# Patient Record
Sex: Male | Born: 1990 | Race: White | Hispanic: No | Marital: Single | Smoking: Never smoker
Health system: Southern US, Community
[De-identification: ages and names within clinical notes are randomized; demographics above are authoritative.]

## PROBLEM LIST (undated history)

## (undated) DIAGNOSIS — F909 Attention-deficit hyperactivity disorder, unspecified type: Secondary | ICD-10-CM

---

## 2017-06-19 ENCOUNTER — Ambulatory Visit
Admission: EM | Admit: 2017-06-19 | Discharge: 2017-06-19 | Disposition: A | Discharge status: Home / Self Care | Payer: PRIVATE HEALTH INSURANCE | Attending: Family Medicine | Admitting: Family Medicine

## 2017-06-19 ENCOUNTER — Other Ambulatory Visit: Payer: Self-pay

## 2017-06-19 ENCOUNTER — Encounter: Payer: Self-pay | Admitting: Gynecology

## 2017-06-19 DIAGNOSIS — R21 Rash and other nonspecific skin eruption: Secondary | ICD-10-CM

## 2017-06-19 DIAGNOSIS — L237 Allergic contact dermatitis due to plants, except food: Secondary | ICD-10-CM | POA: Diagnosis not present

## 2017-06-19 MED ORDER — PREDNISONE 10 MG PO TABS: ORAL_TABLET | ORAL | 0 refills | Status: DC

## 2017-06-19 MED ORDER — HYDROXYZINE HCL 25 MG PO TABS: 25.0000 mg | ORAL_TABLET | Freq: Three times a day (TID) | ORAL | 0 refills | Status: DC | PRN

## 2017-06-19 NOTE — ED Triage Notes (Signed)
Patient c/o rash on body x 2 days ago.

## 2017-06-19 NOTE — Discharge Instructions (Signed)
Take medication as prescribed. Avoid scratching. Monitor.  Follow up with your primary care physician this week as needed. Return to Urgent care for new or worsening concerns.   

## 2017-06-19 NOTE — ED Provider Notes (Signed)
[facility] ____________________________________________  Time seen: Approximately 1:24 PM  I have reviewed the triage vital signs and the nursing notes.   HISTORY  Chief Complaint Rash   HPI Kerry Bennett is a 26 y.o. male presenting for evaluation of rash that started to left forearm 2 days ago after being in the woods earlier that day.  Patient states that he was originally wearing a jacket, but got hot and took it off.  States he was walking through the woods in a short sleeve shirt and recalls brushing up against a poison ivy fine on his left forearm.  Patient states that he has a history of poison oak and poison ivy with similar presentation.  States rash started to left forearm and has since spread to right arm as well as left abdomen.  States rash is very itchy.  States has been trying calamine lotion, topical hydrocortisone and Benadryl with some improvement, but no resolution.  States rash continues to spread.  Reports otherwise feels well.  Reports continues to eat and drink well.  Denies fevers, swelling, difficulty breathing.  Denies other aggravating or relieving factors.  Denies changes in foods, medicines, lotions, detergents or other contacts.  Denies cardiac history, diabetes or renal insufficiency.   History reviewed. No pertinent past medical history.  There are no active problems to display for this patient.   History reviewed. No pertinent surgical history.   No current facility-administered medications for this encounter.   Current Outpatient Medications:  .  hydrOXYzine (ATARAX/VISTARIL) 25 MG tablet, Take 1 tablet (25 mg total) by mouth every 8 (eight) hours as needed for itching., Disp: 15 tablet, Rfl: 0 .  predniSONE (DELTASONE) 10 MG tablet, Take 60mg  orally day one, then 55 mg orally day two, then 50 mg orally day three, then taper by 5 mg daily over 12 days until complete., Disp: 35 tablet, Rfl: 0  Allergies Patient has no known  allergies.  Family History  Problem Relation Age of Onset  . Healthy Mother   . Healthy Father     Social History Social History   Tobacco Use  . Smoking status: Never Smoker  . Smokeless tobacco: Never Used  Substance Use Topics  . Alcohol use: No    Frequency: Never  . Drug use: No    Review of Systems Constitutional: No fever/chills Cardiovascular: Denies chest pain. Respiratory: Denies shortness of breath. Gastrointestinal: No abdominal pain.  Skin: positive for rash.  ____________________________________________   PHYSICAL EXAM:  VITAL SIGNS: ED Triage Vitals  Enc Vitals Group     BP 06/19/17 1254 126/68     Pulse Rate 06/19/17 1254 77     Resp 06/19/17 1254 16     Temp 06/19/17 1254 97.7 F (36.5 C)     Temp Source 06/19/17 1254 Oral     SpO2 06/19/17 1254 100 %     Weight 06/19/17 1251 210 lb (95.3 kg)     Height 06/19/17 1251 6\' 1"  (1.854 m)     Head Circumference --      Peak Flow --      Pain Score 06/19/17 1251 3     Pain Loc --      Pain Edu? --      Excl. in GC? --     Constitutional: Alert and oriented. Well appearing and in no acute distress. Cardiovascular: Normal rate, regular rhythm. Grossly normal heart sounds.  Good peripheral circulation. Respiratory: Normal respiratory effort without tachypnea nor retractions. Breath sounds are  clear and equal bilaterally. No wheezes, rales, rhonchi. Musculoskeletal: Ambulatory with steady gait. Neurologic:  Normal speech and language.  Speech is normal. No gait instability.  Skin:  Skin is warm, dry.  Except: Bilateral arms and left abdomen mildly erythematous scattered vesicular and clustered vesicular areas, pruritic, no edema, no drainage, no surrounding erythema. Psychiatric: Mood and affect are normal. Speech and behavior are normal. Patient exhibits appropriate insight and judgment   ___________________________________________   LABS (all labs ordered are listed, but only abnormal results are  displayed)  Labs Reviewed - No data to display ____________________________________________   PROCEDURES Procedures    INITIAL IMPRESSION / ASSESSMENT AND PLAN / ED COURSE  Pertinent labs & imaging results that were available during my care of the patient were reviewed by me and considered in my medical decision making (see chart for details).  Well-appearing patient.  No acute distress.  Rash clinical appearance consistent with contact plant dermatitis.  Will treat patient with prednisone taper, as needed hydroxyzine.  Encourage rest, fluids, supportive care, monitoring and avoidance of trigger.Discussed indication, risks and benefits of medications with patient.  Discussed follow up with Primary care physician this week as needed. Discussed follow up and return parameters including no resolution or any worsening concerns. Patient verbalized understanding and agreed to plan.   ____________________________________________   FINAL CLINICAL IMPRESSION(S) / ED DIAGNOSES  Final diagnoses:  Plant allergic contact dermatitis     ED Discharge Orders        Ordered    predniSONE (DELTASONE) 10 MG tablet     06/19/17 1323    hydrOXYzine (ATARAX/VISTARIL) 25 MG tablet  Every 8 hours PRN     06/19/17 1323       Note: This dictation was prepared with Dragon dictation along with smaller phrase technology. Any transcriptional errors that result from this process are unintentional.         Renford DillsMiller, Demetreus Lothamer, NP 06/19/17 1328

## 2017-07-01 ENCOUNTER — Other Ambulatory Visit: Payer: Self-pay

## 2017-07-01 ENCOUNTER — Ambulatory Visit
Admission: EM | Admit: 2017-07-01 | Discharge: 2017-07-01 | Disposition: A | Discharge status: Home / Self Care | Payer: PRIVATE HEALTH INSURANCE | Attending: Family Medicine | Admitting: Family Medicine

## 2017-07-01 ENCOUNTER — Encounter: Payer: Self-pay | Admitting: Emergency Medicine

## 2017-07-01 DIAGNOSIS — L255 Unspecified contact dermatitis due to plants, except food: Secondary | ICD-10-CM

## 2017-07-01 DIAGNOSIS — R21 Rash and other nonspecific skin eruption: Secondary | ICD-10-CM | POA: Diagnosis not present

## 2017-07-01 MED ORDER — PREDNISONE 10 MG PO TABS: ORAL_TABLET | ORAL | 0 refills | Status: DC

## 2017-07-01 NOTE — Discharge Instructions (Signed)
Prednisone as prescribed. ° °Take care ° °Dr. Sammi Stolarz  °

## 2017-07-01 NOTE — ED Triage Notes (Signed)
Patient [facility] that [facility] 12 [facility] Pred[facility].  Patient states that is had gotten better but know has started to come back.

## 2017-07-01 NOTE — ED Provider Notes (Signed)
[facility]    CSN: [id number] Arrival date & time: 07/01/17  1626  History   Chief Complaint Chief Complaint  Patient presents with  . Rash    APPOINTMENT   HPI  27 year old male presents for recurrence of poison ivy dermatitis.  Patient states that he was recently seen here and was treated for poison ivy/oak/sumac dermatitis with a 12-day course of prednisone.  He had improvement but then recurrence of his rash over the past few days.  Itchy and seems to be worsening.  No new repeat exposure.  No other associated symptoms.  No other complaints at this time  Social History Social History   Tobacco Use  . Smoking status: Never Smoker  . Smokeless tobacco: Never Used  Substance Use Topics  . Alcohol use: No    Frequency: Never  . Drug use: No   Allergies   Patient has no known allergies.   Review of Systems Review of Systems  Constitutional: Negative.   Skin: Positive for rash.     Physical Exam Triage Vital Signs ED Triage Vitals  Enc Vitals Group     BP 07/01/17 1635 132/67     Pulse Rate 07/01/17 1635 69     Resp 07/01/17 1635 16     Temp 07/01/17 1635 98.3 F (36.8 C)     Temp Source 07/01/17 1635 Oral     SpO2 07/01/17 1635 100 %     Weight 07/01/17 1633 215 lb (97.5 kg)     Height 07/01/17 1633 6\' 2"  (1.88 m)     Head Circumference --      Peak Flow --      Pain Score 07/01/17 1633 0     Pain Loc --      Pain Edu? --      Excl. in GC? --    Updated Vital Signs BP 132/67 (BP Location: Left Arm)   Pulse 69   Temp 98.3 F (36.8 C) (Oral)   Resp 16   Ht 6\' 2"  (1.88 m)   Wt 215 lb (97.5 kg)   SpO2 100%   BMI 27.60 kg/m     Physical Exam  Constitutional: He is oriented to person, place, and time. He appears well-developed. No distress.  Pulmonary/Chest: Effort normal. No respiratory distress.  Neurological: He is alert and oriented to person, place, and time.  Skin:  Patient with areas of dry, erythematous rash -abdomen, left  arm  Psychiatric: He has a normal mood and affect. His behavior is normal.  Nursing note and vitals reviewed.  UC Treatments / Results  Labs (all labs ordered are listed, but only abnormal results are displayed) Labs Reviewed - No data to display  EKG  EKG Interpretation None       Radiology No results found.  Procedures Procedures (including critical care time)  Medications Ordered in UC Medications - No data to display   Initial Impression / Assessment and Plan / UC Course  I have reviewed the triage vital signs and the nursing notes.  Pertinent labs & imaging results that were available during my care of the patient were reviewed by me and considered in my medical decision making (see chart for details).     27 year old male presents with recurrence/rebound of recent dermatitis.  Treating with 15-day course of prednisone.  Final Clinical Impressions(s) / UC Diagnoses   Final diagnoses:  Dermatitis due to plants, including poison ivy, sumac, and oak    ED Discharge Orders  Ordered    predniSONE (DELTASONE) 10 MG tablet     07/01/17 1658     Controlled Substance Prescriptions [place] Controlled Substance Registry consulted? Not Applicable   Tommie Sams, DO 07/01/17 1724

## 2017-07-04 ENCOUNTER — Telehealth: Payer: Self-pay | Admitting: Emergency Medicine

## 2017-07-04 NOTE — Telephone Encounter (Signed)
Called to follow up with patient after his recent visit. Left message on voice mail.

## 2017-09-02 ENCOUNTER — Ambulatory Visit
Admission: EM | Admit: 2017-09-02 | Discharge: 2017-09-02 | Disposition: A | Discharge status: Home / Self Care | Payer: PRIVATE HEALTH INSURANCE | Attending: Family Medicine | Admitting: Family Medicine

## 2017-09-02 ENCOUNTER — Encounter: Payer: Self-pay | Admitting: *Deleted

## 2017-09-02 DIAGNOSIS — J069 Acute upper respiratory infection, unspecified: Secondary | ICD-10-CM

## 2017-09-02 LAB — RAPID STREP SCREEN (MED CTR MEBANE ONLY): Streptococcus, Group A Screen (Direct): NEGATIVE

## 2017-09-02 MED ORDER — BENZONATATE 200 MG PO CAPS: ORAL_CAPSULE | ORAL | 0 refills | Status: DC

## 2017-09-02 MED ORDER — FLUTICASONE PROPIONATE 50 MCG/ACT NA SUSP: 2.0000 | Freq: Every day | NASAL | 0 refills | Status: DC

## 2017-09-02 MED ORDER — HYDROCOD POLST-CPM POLST ER 10-8 MG/5ML PO SUER: 5.0000 mL | Freq: Two times a day (BID) | ORAL | 0 refills | Status: DC

## 2017-09-02 MED ORDER — PSEUDOEPHEDRINE-GUAIFENESIN ER 60-600 MG PO TB12: 1.0000 | ORAL_TABLET | Freq: Two times a day (BID) | ORAL | 0 refills | Status: DC

## 2017-09-02 NOTE — ED Provider Notes (Signed)
[facility]    CSN: [id number] Arrival date & time: 09/02/17  1418     History   Chief Complaint Chief Complaint  Patient presents with  . Sore Throat  . Cough    HPI Kerry Bennett is a 27 y.o. male.   HPI  27 year old male presents with a 3-day history of sore throat congestion and cough.  Had no fever or chills.  It all started with a sore throat and this persists at the present time.  Cough came later and has been productive of yellow sputum.  He is a non-smoker.       History reviewed. No pertinent past medical history.  There are no active problems to display for this patient.   History reviewed. No pertinent surgical history.     Home Medications    Prior to Admission medications   Medication Sig Start Date End Date Taking? Authorizing Provider  benzonatate (TESSALON) 200 MG capsule Take one cap TID PRN cough 09/02/17   Lutricia Feil, PA-C  chlorpheniramine-HYDROcodone [facility]) 10-8 MG/5ML SUER Take 5 mLs by mouth 2 (two) times daily. 09/02/17   Lutricia Feil, PA-C  fluticasone (FLONASE) 50 MCG/ACT nasal spray Place 2 sprays into both nostrils daily. 09/02/17   Lutricia Feil, PA-C  predniSONE (DELTASONE) 10 MG tablet 50 mg (5 tablets) daily x 3 days, then 40 mg (4 tablets) daily x 3 days, then 30 mg (3 tablets) daily x 3 days, then 20 mg (2 tablets) daily x 3 days, then 10 mg (1 tablet) daily x 3 days. 07/01/17   Tommie Sams, DO  pseudoephedrine-guaifenesin (MUCINEX D) 60-600 MG 12 hr tablet Take 1 tablet by mouth every 12 (twelve) hours. 09/02/17   Lutricia Feil, PA-C    Family History Family History  Problem Relation Age of Onset  . Healthy Mother   . Healthy Father     Social History Social History   Tobacco Use  . Smoking status: Never Smoker  . Smokeless tobacco: Never Used  Substance Use Topics  . Alcohol use: No    Frequency: Never  . Drug use: No     Allergies   Patient has no known  allergies.   Review of Systems Review of Systems  Constitutional: Positive for activity change. Negative for chills, fatigue and fever.  HENT: Positive for congestion, postnasal drip, sinus pressure and sinus pain.   Respiratory: Positive for cough.   All other systems reviewed and are negative.    Physical Exam Triage Vital Signs ED Triage Vitals  Enc Vitals Group     BP 09/02/17 1449 113/77     Pulse Rate 09/02/17 1449 70     Resp 09/02/17 1449 16     Temp 09/02/17 1449 98.3 F (36.8 C)     Temp Source 09/02/17 1449 Oral     SpO2 09/02/17 1449 100 %     Weight 09/02/17 1452 215 lb (97.5 kg)     Height 09/02/17 1452 6\' 2"  (1.88 m)     Head Circumference --      Peak Flow --      Pain Score --      Pain Loc --      Pain Edu? --      Excl. in GC? --    No data found.  Updated Vital Signs BP 113/77 (BP Location: Left Arm)   Pulse 70   Temp 98.3 F (36.8 C) (Oral)   Resp 16  Ht 6\' 2"  (1.88 m)   Wt 215 lb (97.5 kg)   SpO2 100%   BMI 27.60 kg/m   Visual Acuity Right Eye Distance:   Left Eye Distance:   Bilateral Distance:    Right Eye Near:   Left Eye Near:    Bilateral Near:     Physical Exam  Constitutional: He is oriented to person, place, and time. He appears well-developed and well-nourished.  Non-toxic appearance. He does not appear ill. No distress.  HENT:  Head: Normocephalic.  Right Ear: Tympanic membrane normal.  Left Ear: Tympanic membrane normal.  Mouth/Throat: Uvula is midline, oropharynx is clear and moist and mucous membranes are normal. No oral lesions. No uvula swelling. No oropharyngeal exudate, posterior oropharyngeal edema, posterior oropharyngeal erythema or tonsillar abscesses. Tonsils are 0 on the right. Tonsils are 0 on the left. No tonsillar exudate.  Eyes: Pupils are equal, round, and reactive to light.  Neck: Normal range of motion.  Pulmonary/Chest: Effort normal and breath sounds normal.  Neurological: He is alert and oriented  to person, place, and time.  Skin: Skin is warm and dry.  Psychiatric: He has a normal mood and affect. His behavior is normal.  Nursing note and vitals reviewed.    UC Treatments / Results  Labs (all labs ordered are listed, but only abnormal results are displayed) Labs Reviewed  RAPID STREP SCREEN (NOT AT [facility])  CULTURE, GROUP A STREP [facility])    EKG  EKG Interpretation None       Radiology No results found.  Procedures Procedures (including critical care time)  Medications Ordered in UC Medications - No data to display   Initial Impression / Assessment and Plan / UC Course  I have reviewed the triage vital signs and the nursing notes.  Pertinent labs & imaging results that were available during my care of the patient were reviewed by me and considered in my medical decision making (see chart for details).     Plan: 1. Test/x-ray results and diagnosis reviewed with patient 2. rx as per orders; risks, benefits, potential side effects reviewed with patient 3. Recommend supportive treatment with Flonase and Mucinex D for congestion.  Provide him with Jerilynn Somessalon Perles for daytime use and Tussionex for nighttime.  He was cautioned not to drive or perform activities requiring concentration or judgment.  I told him this is a viral illness and does not require antibiotics. 4. F/u prn if symptoms worsen or don't improve   Final Clinical Impressions(s) / UC Diagnoses   Final diagnoses:  Upper respiratory tract infection, unspecified type    ED Discharge Orders        Ordered    benzonatate (TESSALON) 200 MG capsule     09/02/17 1546    chlorpheniramine-HYDROcodone (TUSSIONEX PENNKINETIC ER) 10-8 MG/5ML SUER  2 times daily     09/02/17 1546    fluticasone (FLONASE) 50 MCG/ACT nasal spray  Daily     09/02/17 1546    pseudoephedrine-guaifenesin (MUCINEX D) 60-600 MG 12 hr tablet  Every 12 hours     09/02/17 1546       Controlled Substance Prescriptions [place]  Controlled Substance Registry consulted? Not Applicable   Lutricia FeilRoemer, Nyiesha Beever P, PA-C 09/02/17 1552

## 2017-09-02 NOTE — ED Triage Notes (Signed)
Sore throat, congestion, cough x3 days. Denies other symptoms.

## 2017-09-05 LAB — CULTURE, GROUP A STREP (THRC)

## 2018-04-26 ENCOUNTER — Ambulatory Visit: Payer: PRIVATE HEALTH INSURANCE | Admitting: Family Medicine

## 2018-05-10 ENCOUNTER — Encounter: Payer: Self-pay | Admitting: Family Medicine

## 2018-05-10 ENCOUNTER — Ambulatory Visit: Payer: 59 | Admitting: Family Medicine

## 2018-05-10 VITALS — BP 120/78 | HR 81 | Temp 98.0°F | Ht 72.5 in | Wt 218.0 lb

## 2018-05-10 DIAGNOSIS — R41840 Attention and concentration deficit: Secondary | ICD-10-CM

## 2018-05-10 DIAGNOSIS — E663 Overweight: Secondary | ICD-10-CM

## 2018-05-10 DIAGNOSIS — Z0000 Encounter for general adult medical examination without abnormal findings: Secondary | ICD-10-CM

## 2018-05-10 LAB — LIPID PANEL
CHOL/HDL RATIO: 4
Cholesterol: 176 mg/dL (ref 0–200)
HDL: 44.8 mg/dL (ref >39.00)
LDL Cholesterol: 107 mg/dL — ABNORMAL HIGH (ref 0–99)
NONHDL: 131.41
Triglycerides: 123 mg/dL (ref 0.0–149.0)
VLDL: 24.6 mg/dL (ref 0.0–40.0)

## 2018-05-10 LAB — BASIC METABOLIC PANEL
BUN: 13 mg/dL (ref 6–23)
CO2: 31 meq/L (ref 19–32)
Calcium: 9.6 mg/dL (ref 8.4–10.5)
Chloride: 103 mEq/L (ref 96–112)
Creatinine, Ser: 1.09 mg/dL (ref 0.40–1.50)
GFR: 85.24 mL/min (ref >60.00)
Glucose, Bld: 98 mg/dL (ref 70–99)
POTASSIUM: 4.2 meq/L (ref 3.5–5.1)
Sodium: 140 mEq/L (ref 135–145)

## 2018-05-10 NOTE — Progress Notes (Signed)
Subjective:    Patient ID: Kerry Bennett, male    DOB: 1991-06-13, 27 y.o.   MRN: [id number]  HPI This is 27 yo male who presents today to establish care and for CPE. He lives with his fiancee, wedding is April. He works in [employer] as a Merchandiser, retail.     Last CPE- many years ago Tdap- required for his job, thinks he is up to date, will look for his records Flu- declines Dental- annual Exercise- several times a week- goes to the gym Stress- manageable Sleep- 7-8 hours nightly  No past medical history on file. No past surgical history on file. Family History  Problem Relation Age of Onset  . Healthy Mother   . Healthy Father    Social History   Tobacco Use  . Smoking status: Never Smoker  . Smokeless tobacco: Never Used  Substance Use Topics  . Alcohol use: No    Frequency: Never  . Drug use: No      Review of Systems  Constitutional: Negative.   HENT: Negative.   Eyes: Negative.   Respiratory: Negative.   Cardiovascular: Negative.   Gastrointestinal: Negative.   Endocrine: Negative.   Genitourinary: Negative.   Musculoskeletal: Negative.   Skin: Negative.   Allergic/Immunologic: Negative.   Neurological: Negative for headaches.       Has had more difficulty concentrating and staying focused over last several years. Denies any difficulties in childhood with attention. Denies anxiety, depression or increased stress. More difficulty multi-tasking. Father with ADHD.   Hematological: Negative.   Psychiatric/Behavioral: Negative.        Objective:   Physical Exam Physical Exam  Constitutional: He is oriented to person, place, and time. He appears well-developed and well-nourished.  HENT:  Head: Normocephalic and atraumatic.  Right Ear: External ear normal.  Left Ear: External ear normal.  Nose: Nose normal.  Mouth/Throat: Oropharynx is clear and moist.  Eyes: Conjunctivae are normal. Pupils are equal, round, and reactive to light.  Neck: Normal range of  motion. Neck supple.  Cardiovascular: Normal rate, regular rhythm, normal heart sounds and intact distal pulses.   Pulmonary/Chest: Effort normal and breath sounds normal.  Abdominal: Soft. Bowel sounds are normal. Hernia confirmed negative in the right inguinal area and confirmed negative in the left inguinal area.  Genitourinary: Testes normal and penis normal. Circumcised.  Musculoskeletal: Normal range of motion. He exhibits no edema or tenderness.       Cervical back: Normal.       Thoracic back: Normal.       Lumbar back: Normal.  Lymphadenopathy:    He has no cervical adenopathy.       Right: No inguinal adenopathy present.       Left: No inguinal adenopathy present.  Neurological: He is alert and oriented to person, place, and time. He has normal reflexes.  Skin: Skin is warm and dry.  Psychiatric: He has a normal mood and affect. His behavior is normal. Judgment normal.  Vitals reviewed.   BP 120/78   Pulse 81   Temp 98 F (36.7 C) (Oral)   Ht 6' 0.5" (1.842 m)   Wt 218 lb (98.9 kg)   SpO2 98%   BMI 29.16 kg/m      Assessment & Plan:  1. Annual physical exam - Discussed and encouraged healthy lifestyle choices- adequate sleep, regular exercise, stress management and healthy food choices.   2. Overweight (BMI 25.0-29.9) - encouraged healthy food choices, regular exercise - Lipid Panel -  Basic Metabolic Panel  3. Difficulty concentrating - discussed available resources for testing and encouraged him to schedule appointment   Olean Reeeborah Shaquinta Peruski, FNP-BC  [facility]  05/10/2018 8:45 PM

## 2018-05-10 NOTE — Patient Instructions (Addendum)
I recommend you get tested for ADHD- [facility] [place] [phone]   Preventive Care 18-39 Years, Male Preventive care refers to lifestyle choices and visits with your health care provider that can promote health and wellness. What does preventive care include?  A yearly physical exam. This is also called an annual well check.  Dental exams once or twice a year.  Routine eye exams. Ask your health care provider how often you should have your eyes checked.  Personal lifestyle choices, including: ? Daily care of your teeth and gums. ? Regular physical activity. ? Eating a healthy diet. ? Avoiding tobacco and drug use. ? Limiting alcohol use. ? Practicing safe sex. What happens during an annual well check? The services and screenings done by your health care provider during your annual well check will depend on your age, overall health, lifestyle risk factors, and family history of disease. Counseling Your health care provider may ask you questions about your:  Alcohol use.  Tobacco use.  Drug use.  Emotional well-being.  Home and relationship well-being.  Sexual activity.  Eating habits.  Work and work Statistician.  Screening You may have the following tests or measurements:  Height, weight, and BMI.  Blood pressure.  Lipid and cholesterol levels. These may be checked every 5 years starting at age 13.  Diabetes screening. This is done by checking your blood sugar (glucose) after you have not eaten for a while (fasting).  Skin check.  Hepatitis C blood test.  Hepatitis B blood test.  Sexually transmitted disease (STD) testing.  Discuss your test results, treatment options, and if necessary, the need for more tests with your health care provider. Vaccines Your health care provider may recommend certain vaccines, such as:  Influenza vaccine. This is recommended every year.  Tetanus,  diphtheria, and acellular pertussis (Tdap, Td) vaccine. You may need a Td booster every 10 years.  Varicella vaccine. You may need this if you have not been vaccinated.  HPV vaccine. If you are 68 or younger, you may need three doses over 6 months.  Measles, mumps, and rubella (MMR) vaccine. You may need at least one dose of MMR.You may also need a second dose.  Pneumococcal 13-valent conjugate (PCV13) vaccine. You may need this if you have certain conditions and have not been vaccinated.  Pneumococcal polysaccharide (PPSV23) vaccine. You may need one or two doses if you smoke cigarettes or if you have certain conditions.  Meningococcal vaccine. One dose is recommended if you are age 63-21 years and a first-year college student living in a residence hall, or if you have one of several medical conditions. You may also need additional booster doses.  Hepatitis A vaccine. You may need this if you have certain conditions or if you travel or work in places where you may be exposed to hepatitis A.  Hepatitis B vaccine. You may need this if you have certain conditions or if you travel or work in places where you may be exposed to hepatitis B.  Haemophilus influenzae type b (Hib) vaccine. You may need this if you have certain risk factors.  Talk to your health care provider about which screenings and vaccines you need and how often you need them. This information is not intended to replace advice given to you by your health care provider. Make sure you discuss any questions you have with your health care provider. Document Released: 08/03/2001 Document Revised: 02/25/2016 Document Reviewed: 04/08/2015 Elsevier Interactive Patient Education  2018 [place].

## 2018-05-12 ENCOUNTER — Encounter: Payer: Self-pay | Admitting: Family Medicine

## 2018-10-02 ENCOUNTER — Encounter: Payer: Self-pay | Admitting: Family Medicine

## 2018-10-02 ENCOUNTER — Ambulatory Visit (INDEPENDENT_AMBULATORY_CARE_PROVIDER_SITE_OTHER): Payer: 59 | Admitting: Family Medicine

## 2018-10-02 VITALS — Temp 97.4°F

## 2018-10-02 DIAGNOSIS — J069 Acute upper respiratory infection, unspecified: Secondary | ICD-10-CM

## 2018-10-02 DIAGNOSIS — F909 Attention-deficit hyperactivity disorder, unspecified type: Secondary | ICD-10-CM | POA: Insufficient documentation

## 2018-10-02 DIAGNOSIS — B9789 Other viral agents as the cause of diseases classified elsewhere: Secondary | ICD-10-CM | POA: Diagnosis not present

## 2018-10-02 NOTE — Progress Notes (Signed)
Virtual Visit via Video Note  I connected with Kerry Bennett on 10/02/18 at  3:30 PM EDT by a video enabled telemedicine application and verified that I am speaking with the correct person using two identifiers.   I discussed the limitations of evaluation and management by telemedicine and the availability of in person appointments. The patient expressed understanding and agreed to proceed.  History of Present Illness: This is a 28 yo male who requests virtual visit to discuss recent symptoms.  He has had cough that started approximately 1 week ago, at first it was a little productive but has recently been dry.  He feels winded with activity but not at rest.  No wheeze.  For about 5 days he has had a slight runny nose and headache behind his eyes, some mild nasal congestion, very little postnasal drainage.  Has been clearing his throat and feels like his throat is a little swollen.  Couple of days ago he was achy.  He has been fatigued.  He has been checking his temperature and he has not run a fever above 97.5.  He is recently gotten married.  His wife is a MICU nurse at [employer] and has COVID-19 known exposure.  He has taken some NyQuil the last couple of nights and has slept well.  He has taken Mucinex which seems to have helped with his congestion.  He is able to work from home.     Observations/Objective: Patient is alert and answers questions appropriately.  Skin normal color.  He is walking around his home and does not appear to be short of breath.  He is able to converse normally without shortness of breath.  Mood and affect are normal.  Assessment and Plan: 1. Viral URI with cough -Discussed possible diagnoses including COVID-19.  Unfortunately, there is lack of availability of testing in the outpatient setting at this time. -Discussed treating symptomatically and strict return to clinic/ER precautions -Out of work note written -MyChart instructions to patient   Olean Ree,  FNP-BC   Primary Care at [facility], [facility]  10/02/2018 3:50 PM   Follow Up Instructions:    I discussed the assessment and treatment plan with the patient. The patient was provided an opportunity to ask questions and all were answered. The patient agreed with the plan and demonstrated an understanding of the instructions.   The patient was advised to call back or seek an in-person evaluation if the symptoms worsen or if the condition fails to improve as anticipated.   Emi Belfast, FNP

## 2020-07-03 ENCOUNTER — Other Ambulatory Visit: Payer: Self-pay

## 2020-07-03 ENCOUNTER — Encounter: Payer: Self-pay | Admitting: Emergency Medicine

## 2020-07-03 ENCOUNTER — Ambulatory Visit
Admission: EM | Admit: 2020-07-03 | Discharge: 2020-07-03 | Disposition: A | Discharge status: Home / Self Care | Payer: 59 | Attending: Physician Assistant | Admitting: Physician Assistant

## 2020-07-03 ENCOUNTER — Ambulatory Visit: Admit: 2020-07-03 | Disposition: A | Discharge status: Still In Hospital | Payer: Self-pay

## 2020-07-03 DIAGNOSIS — R059 Cough, unspecified: Secondary | ICD-10-CM | POA: Diagnosis not present

## 2020-07-03 DIAGNOSIS — Z20822 Contact with and (suspected) exposure to covid-19: Secondary | ICD-10-CM | POA: Insufficient documentation

## 2020-07-03 DIAGNOSIS — B349 Viral infection, unspecified: Secondary | ICD-10-CM | POA: Diagnosis not present

## 2020-07-03 MED ORDER — CHERATUSSIN AC 100-10 MG/5ML PO SOLN: 10.0000 mL | Freq: Three times a day (TID) | ORAL | 0 refills | Status: AC | PRN

## 2020-07-03 NOTE — ED Provider Notes (Signed)
[facility]    CSN: [id number] Arrival date & time: 07/03/20  1030      History   Chief Complaint Chief Complaint  Patient presents with  . Cough    HPI Kerry Bennett is a 30 y.o. male presenting for cough and congestion x 6 days.  He says that the cough is productive of yellowish-green sputum.  Denies known COVID exposure and has been fully vaccinated for COVID. Patient says his wife has been ill with similar symptoms for a couple of weeks.  Patient denies any fever, fatigue, body aches, sore throat, ear pain, sinus pain, chest pain, shortness of breath, abdominal pain, nausea/vomiting or diarrhea.  Patient says that he has tried all of the over-the-counter cough medicines as well as vitamin C and zinc and nothing is helped and he believes his symptoms are getting worse.  He is otherwise healthy without any history of cardiopulmonary disease.  He denies any other complaints or concerns.  HPI  History reviewed. No pertinent past medical history.  Patient Active Problem List   Diagnosis Date Noted  . ADHD 10/02/2018    History reviewed. No pertinent surgical history.     Home Medications    Prior to Admission medications   Medication Sig Start Date End Date Taking? Authorizing Provider  ADDERALL XR 20 MG 24 hr capsule  07/06/18  Yes [provider]  guaiFENesin-codeine (CHERATUSSIN AC) 100-10 MG/5ML syrup Take 10 mLs by mouth 3 (three) times daily as needed for up to 7 days for cough. 07/03/20 07/10/20 Yes Shirlee Latch, PA-C    Family History Family History  Problem Relation Age of Onset  . Healthy Mother   . Healthy Father     Social History Social History   Tobacco Use  . Smoking status: Never Smoker  . Smokeless tobacco: Never Used  Vaping Use  . Vaping Use: Never used  Substance Use Topics  . Alcohol use: No  . Drug use: No     Allergies   Patient has no known allergies.   Review of Systems Review of Systems  Constitutional:  Negative for fatigue and fever.  HENT: Positive for congestion and rhinorrhea. Negative for sinus pressure, sinus pain and sore throat.   Respiratory: Positive for cough. Negative for shortness of breath.   Cardiovascular: Negative for chest pain.  Gastrointestinal: Negative for abdominal pain, diarrhea, nausea and vomiting.  Musculoskeletal: Negative for myalgias.  Neurological: Negative for weakness, light-headedness and headaches.  Hematological: Negative for adenopathy.     Physical Exam Triage Vital Signs ED Triage Vitals  Enc Vitals Group     BP 07/03/20 1102 117/81     Pulse Rate 07/03/20 1102 73     Resp 07/03/20 1102 16     Temp 07/03/20 1102 98.2 F (36.8 C)     Temp Source 07/03/20 1102 Oral     SpO2 07/03/20 1102 100 %     Weight 07/03/20 1059 215 lb (97.5 kg)     Height 07/03/20 1059 6\' 2"  (1.88 m)     Head Circumference --      Peak Flow --      Pain Score 07/03/20 1059 0     Pain Loc --      Pain Edu? --      Excl. in GC? --    No data found.  Updated Vital Signs BP 117/81 (BP Location: Left Arm)   Pulse 73   Temp 98.2 F (36.8 C) (Oral)  Resp 16   Ht 6\' 2"  (1.88 m)   Wt 215 lb (97.5 kg)   SpO2 100%   BMI 27.60 kg/m       Physical Exam Vitals and nursing note reviewed.  Constitutional:      General: He is not in acute distress.    Appearance: Normal appearance. He is well-developed and well-nourished. He is not ill-appearing or diaphoretic.  HENT:     Head: Normocephalic and atraumatic.     Right Ear: Tympanic membrane, ear canal and external ear normal.     Left Ear: Tympanic membrane, ear canal and external ear normal.     Nose: Congestion and rhinorrhea (trace clear drainage) present.     Mouth/Throat:     Mouth: Oropharynx is clear and moist and mucous membranes are normal. Mucous membranes are moist.     Pharynx: Oropharynx is clear. Uvula midline. No posterior oropharyngeal erythema.     Tonsils: No tonsillar abscesses.  Eyes:      General: No scleral icterus.       Right eye: No discharge.        Left eye: No discharge.     Extraocular Movements: EOM normal.     Conjunctiva/sclera: Conjunctivae normal.  Neck:     Thyroid: No thyromegaly.     Trachea: No tracheal deviation.  Cardiovascular:     Rate and Rhythm: Normal rate and regular rhythm.     Heart sounds: Normal heart sounds.  Pulmonary:     Effort: Pulmonary effort is normal. No respiratory distress.     Breath sounds: Normal breath sounds. No wheezing, rhonchi or rales.  Musculoskeletal:     Cervical back: Neck supple.  Skin:    General: Skin is warm and dry.     Findings: No rash.  Neurological:     General: No focal deficit present.     Mental Status: He is alert. Mental status is at baseline.     Motor: No weakness.     Gait: Gait normal.  Psychiatric:        Mood and Affect: Mood normal.        Behavior: Behavior normal.        Thought Content: Thought content normal.      UC Treatments / Results  Labs (all labs ordered are listed, but only abnormal results are displayed) Labs Reviewed  SARS CORONAVIRUS 2 (TAT 6-24 HRS)    EKG   Radiology No results found.  Procedures Procedures (including critical care time)  Medications Ordered in UC Medications - No data to display  Initial Impression / Assessment and Plan / UC Course  I have reviewed the triage vital signs and the nursing notes.  Pertinent labs & imaging results that were available during my care of the patient were reviewed by me and considered in my medical decision making (see chart for details).   30 year old male presenting for 6-day history of cough and congestion.  All vital signs are normal and stable and he is in no acute distress.  He is well-appearing.  He does have mild nasal congestion and clear rhinorrhea on exam, but the rest of the exam is normal.  COVID testing obtained.  Current CDC guidelines, isolation protocol and ED precautions reviewed with patient.   Advised against antibiotics at this time as there is no indication he likely has a viral illness.  Advised supportive care with increasing rest and fluids.  I did prescribe Cheratussin since he states that he has  tried multiple over-the-counter medicines without improvement in his cough.  Reviewed controlled substance database and patient low risk for abuse.  Encouraged returning or following up with PCP if any symptoms worsen or he is not better over the next couple weeks.  Final Clinical Impressions(s) / UC Diagnoses   Final diagnoses:  Viral illness  Cough     Discharge Instructions     URI/COLD SYMPTOMS: Your exam today is consistent with a viral illness. Antibiotics are not indicated at this time. Use medications as directed, including cough syrup, nasal saline, and decongestants. Your symptoms should improve over the next few days and resolve within the next 2 weeks. Increase rest and fluids. F/u if symptoms worsen or predominate such as sore throat, ear pain, productive cough, shortness of breath, or if you develop high fevers or worsening fatigue over the next several days.      ED Prescriptions    Medication Sig Dispense Auth. Provider   guaiFENesin-codeine (CHERATUSSIN AC) 100-10 MG/5ML syrup Take 10 mLs by mouth 3 (three) times daily as needed for up to 7 days for cough. 120 mL Shirlee Latch, PA-C     I have reviewed the PDMP during this encounter.   Shirlee Latch, PA-C 07/03/20 1228

## 2020-07-03 NOTE — ED Triage Notes (Signed)
Patient c/o cough and chest congestion since Monday.  Patient denies fevers.

## 2020-07-03 NOTE — Discharge Instructions (Addendum)
URI/COLD SYMPTOMS: Your exam today is consistent with a viral illness. Antibiotics are not indicated at this time. Use medications as directed, including cough syrup, nasal saline, and decongestants. Your symptoms should improve over the next few days and resolve within the next 2 weeks. Increase rest and fluids. F/u if symptoms worsen or predominate such as sore throat, ear pain, productive cough, shortness of breath, or if you develop high fevers or worsening fatigue over the next several days.

## 2020-07-04 LAB — SARS CORONAVIRUS 2 (TAT 6-24 HRS): SARS Coronavirus 2: NEGATIVE

## 2021-10-17 ENCOUNTER — Ambulatory Visit (INDEPENDENT_AMBULATORY_CARE_PROVIDER_SITE_OTHER): Payer: 59

## 2021-10-17 ENCOUNTER — Ambulatory Visit
Admission: RE | Admit: 2021-10-17 | Discharge: 2021-10-17 | Disposition: A | Discharge status: Home / Self Care | Payer: 59 | Source: Ambulatory Visit | Attending: Family Medicine | Admitting: Family Medicine

## 2021-10-17 VITALS — BP 113/77 | HR 71 | Temp 98.6°F | Resp 16

## 2021-10-17 DIAGNOSIS — M25552 Pain in left hip: Secondary | ICD-10-CM

## 2021-10-17 HISTORY — DX: Attention-deficit hyperactivity disorder, unspecified type: F90.9

## 2021-10-17 MED ORDER — INDOMETHACIN 50 MG PO CAPS: 50.0000 mg | ORAL_CAPSULE | Freq: Two times a day (BID) | ORAL | 0 refills | Status: AC | PRN

## 2021-10-17 NOTE — ED Triage Notes (Signed)
Pt presents with left Hip pain x 3 months.  ?

## 2021-10-17 NOTE — ED Provider Notes (Signed)
?[facility] ? ? ? ?CSN: [id number] ?Arrival date & time: 10/17/21  5784 ? ? ?  ? ?History   ?Chief Complaint ?Chief Complaint  ?Patient presents with  ? Hip Pain  ?  Entered by patient  ? ? ?HPI ?Kerry Bennett is a 31 y.o. male.  ? ?HPI ?Patient presents today for evaluation of on-going left hip pain x 3 months without any known injury. Patient endorses lateral left hip pain without back pain or radiation into the groin. He is unaware of overextending his left hip. No prior problems with hip or back. Pain waxes and wanes, although has never completely resolved. He has not taken any medication for his symptoms.  ?Past Medical History:  ?Diagnosis Date  ? ADHD   ? ? ?Patient Active Problem List  ? Diagnosis Date Noted  ? ADHD 10/02/2018  ? ? ?History reviewed. No pertinent surgical history. ? ? ? ? ?Home Medications   ? ?Prior to Admission medications   ?Medication Sig Start Date End Date Taking? Authorizing Provider  ?indomethacin (INDOCIN) 50 MG capsule Take 1 capsule (50 mg total) by mouth 2 (two) times daily as needed (Take with Food). 10/17/21  Yes Bing Neighbors, FNP  ?ADDERALL XR 20 MG 24 hr capsule  07/06/18   [provider]  ? ? ?Family History ?Family History  ?Problem Relation Age of Onset  ? Healthy Mother   ? Healthy Father   ? ? ?Social History ?Social History  ? ?Tobacco Use  ? Smoking status: Never  ? Smokeless tobacco: Never  ?Vaping Use  ? Vaping Use: Never used  ?Substance Use Topics  ? Alcohol use: No  ? Drug use: No  ? ? ? ?Allergies   ?Patient has no known allergies. ? ? ?Review of Systems ?Review of Systems ?Pertinent negatives listed in HPI  ? ?Physical Exam ?Triage Vital Signs ?ED Triage Vitals [10/17/21 1007]  ?Enc Vitals Group  ?   BP   ?   Pulse   ?   Resp   ?   Temp   ?   Temp src   ?   SpO2   ?   Weight   ?   Height   ?   Head Circumference   ?   Peak Flow   ?   Pain Score 6  ?   Pain Loc   ?   Pain Edu?   ?   Excl. in GC?   ? ?No data found. ? ?Updated Vital Signs ?BP  113/77 (BP Location: Left Arm)   Pulse 71   Temp 98.6 ?F (37 ?C) (Oral)   Resp 16   SpO2 97%  ? ?Visual Acuity ?Right Eye Distance:   ?Left Eye Distance:   ?Bilateral Distance:   ? ?Right Eye Near:   ?Left Eye Near:    ?Bilateral Near:    ? ?Physical Exam ?Constitutional:   ?   Appearance: Normal appearance.  ?HENT:  ?   Head: Normocephalic and atraumatic.  ?Eyes:  ?   Extraocular Movements: Extraocular movements intact.  ?   Pupils: Pupils are equal, round, and reactive to light.  ?Cardiovascular:  ?   Rate and Rhythm: Normal rate and regular rhythm.  ?Pulmonary:  ?   Effort: Pulmonary effort is normal.  ?   Breath sounds: Normal breath sounds.  ?Musculoskeletal:  ?   Left hip: Tenderness present. No deformity or bony tenderness. Decreased range of motion.  ?Skin: ?   General:  Skin is warm.  ?   Capillary Refill: Capillary refill takes less than 2 seconds.  ?Neurological:  ?   General: No focal deficit present.  ?   Mental Status: He is alert.  ?Psychiatric:     ?   Mood and Affect: Mood normal.     ?   Behavior: Behavior normal.  ? ? ? ?UC Treatments / Results  ?Labs ?(all labs ordered are listed, but only abnormal results are displayed) ?Labs Reviewed - No data to display ? ?EKG ? ? ?Radiology ?DG Hip Unilat W or Wo Pelvis 2-3 Views Left ? ?Addendum Date: 10/17/2021   ?ADDENDUM REPORT: 10/17/2021 12:44 ADDENDUM: Clinical information: Lateral LEFT hip pain with adduction for 3 months. Additional views were provided after initial interpretation. These include AP and lateral projections of the LEFT hip. Mild degenerative changes about the joint. No sign of acute fracture or dislocation about the LEFT hip. No sign of acute process about the bony pelvis. Electronically Signed   By: Donzetta Kohut M.D.   On: 10/17/2021 12:44  ? ?Result Date: 10/17/2021 ( incorrect impression reference right instead of left see Addendum above)  ?CLINICAL DATA:  None FINDINGS: No sign of fracture or bone lesion involving the bony  pelvis. Hips appear located on AP projection. RIGHT hip is located without signs of fracture or acute process. Soft tissues are unremarkable. IMPRESSION: No acute findings. Electronically Signed: By: Donzetta Kohut M.D. On: 10/17/2021 11:13   ? ?Procedures ?Procedures (including critical care time) ? ?Medications Ordered in UC ?Medications - No data to display ? ?Initial Impression / Assessment and Plan / UC Course  ?I have reviewed the triage vital signs and the nursing notes. ? ?Pertinent labs & imaging results that were available during my care of the patient were reviewed by me and considered in my medical decision making (see chart for details). ? ?  ?Lateral pain of left hips, questionable muscle strain of gluteal vs adductor muscle of left hip. Imaging negative. Recommend antiinflammatories consistently for 5-7 days. If pain doesn't immediately resolve following treatment recommend follow-up at [facility]. ?Final Clinical Impressions(s) / UC Diagnoses  ? ?Final diagnoses:  ?Lateral pain of left hip  ? ? ? ?Discharge Instructions   ? ?  ?Trial at least a 7 day course of indomethacin for treatment of what suspect is a muscle vs tendon strain. If pain persists, follow-up with [facility] for further work-up of source of hip pain. ? ? ? ? ?ED Prescriptions   ? ? Medication Sig Dispense Auth. Provider  ? indomethacin (INDOCIN) 50 MG capsule Take 1 capsule (50 mg total) by mouth 2 (two) times daily as needed (Take with Food). 30 capsule Bing Neighbors, FNP  ? ?  ? ?PDMP not reviewed this encounter. ?  ?Bing Neighbors, FNP ?10/21/21 1043 ? ?

## 2021-10-17 NOTE — Discharge Instructions (Addendum)
Trial at least a 7 day course of indomethacin for treatment of what suspect is a muscle vs tendon strain. If pain persists, follow-up with [facility] for further work-up of source of hip pain. ?

## 2021-10-17 NOTE — Progress Notes (Signed)
Good morning Dayn, your imaging was negative for any acute or chronic degenerative changes, however the rad tech is contacting the radiologist to have the impression addendum to reflect left hip instead of right. ?Continue with recommendations. Return here as needed. ? ?Joaquin Courts, FNP

## 2023-04-07 IMAGING — DX DG HIP (WITH OR WITHOUT PELVIS) 2-3V*L*
3 series · 3 of 3 positions shown · non-contrast
Comparison: none

Addendum:
CLINICAL DATA: None
CLINICAL INFORMATION: Lateral LEFT hip pain with adduction for 3
months.

Additional views were provided after initial interpretation. These
include AP and lateral projections of the LEFT hip. Mild
degenerative changes about the joint. No sign of acute fracture or
dislocation about the LEFT hip. No sign of acute process about the
bony pelvis.
*** End of Addendum ***

[pelvis ap]
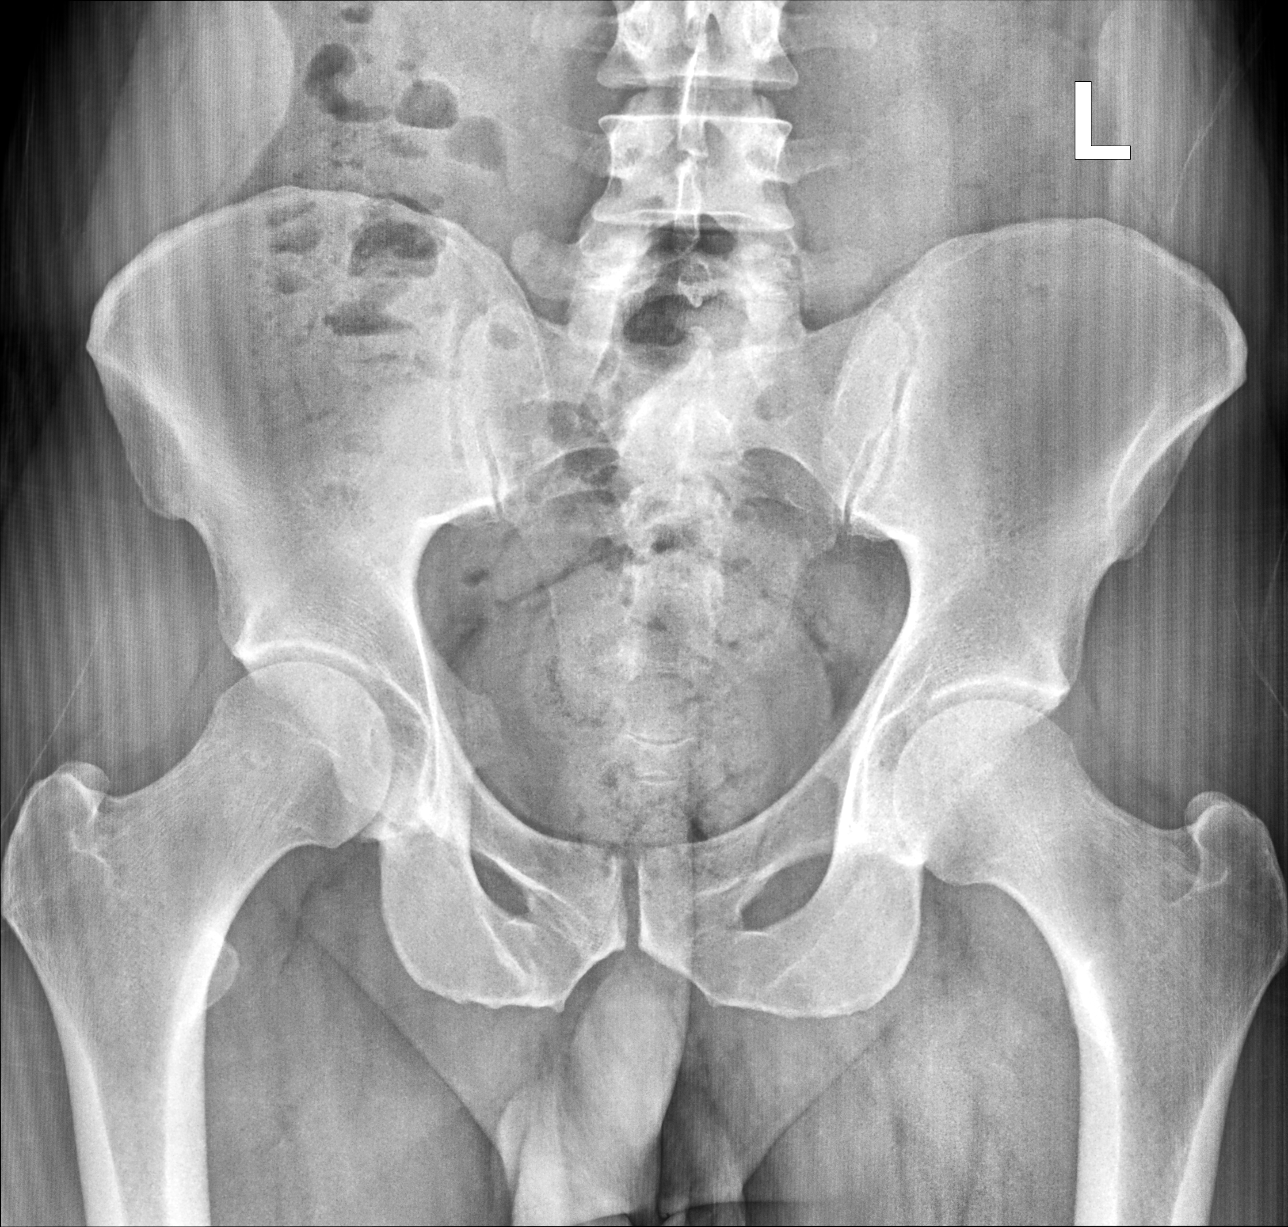

[hip ap]
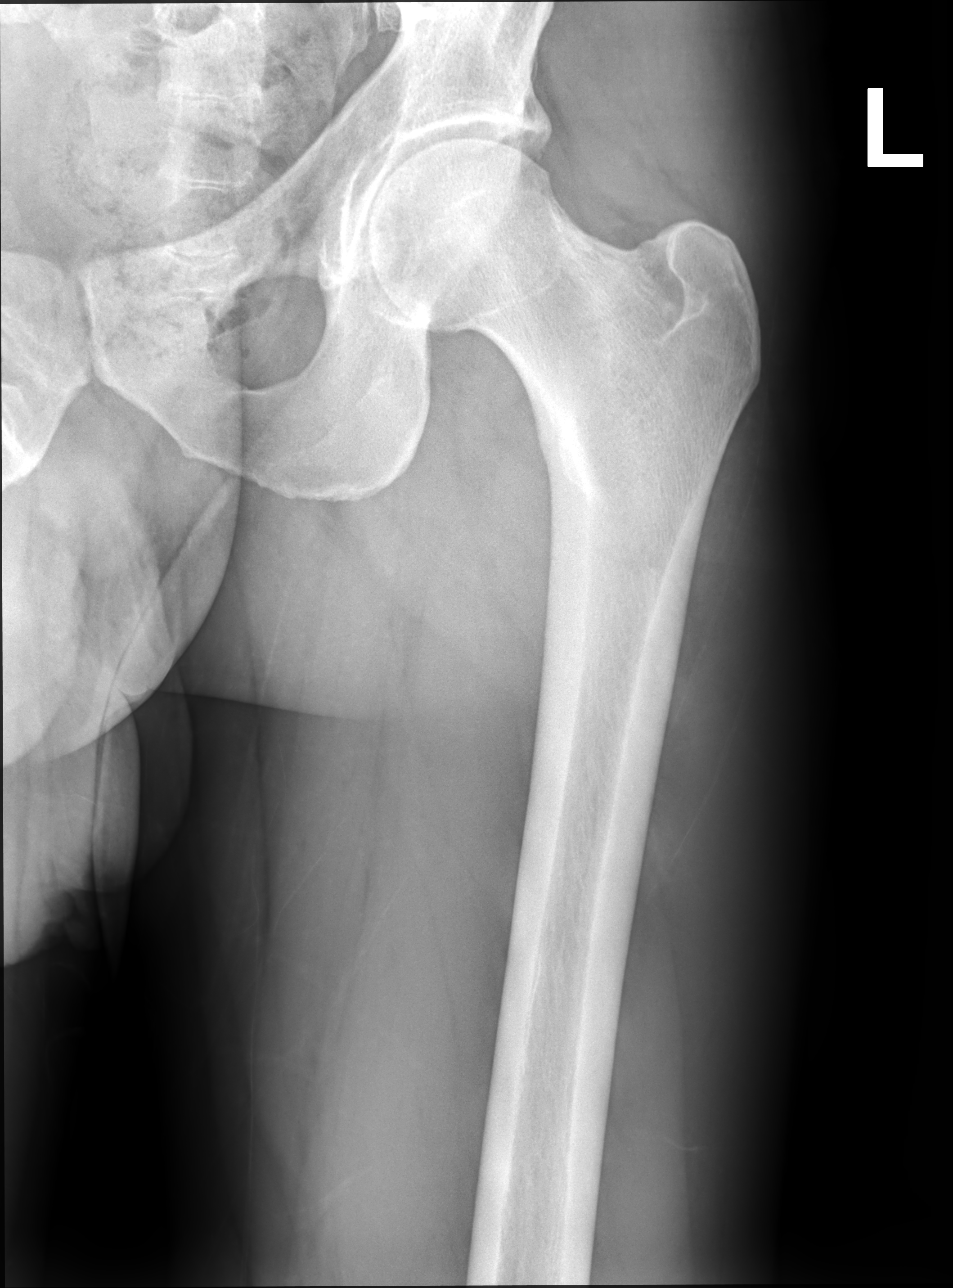

[hip (frog leg)]
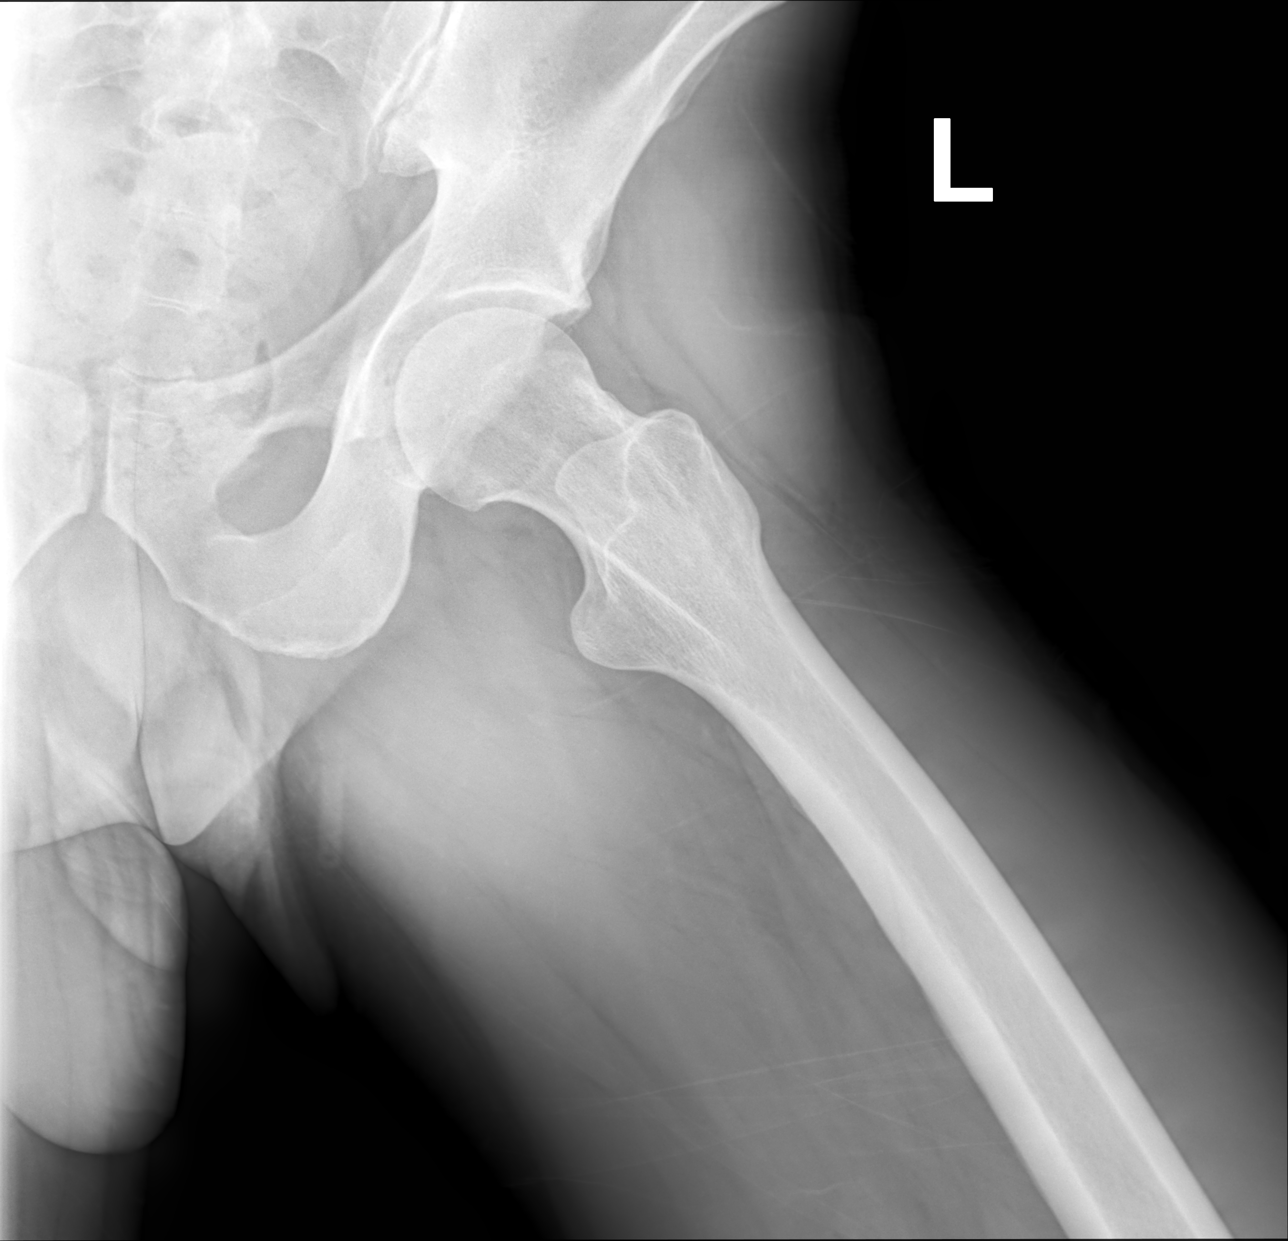

[3 of 3 positions shown; findings below may reference images not displayed]

FINDINGS: No sign of fracture or bone lesion involving the bony pelvis.

Hips appear located on AP projection. RIGHT hip is located without
signs of fracture or acute process. Soft tissues are unremarkable.
IMPRESSION: No acute findings.

ADDENDUM:
FINDINGS: No sign of fracture or bone lesion involving the bony pelvis.

Hips appear located on AP projection. RIGHT hip is located without
signs of fracture or acute process. Soft tissues are unremarkable.
IMPRESSION: No acute findings.

## 2023-11-22 DIAGNOSIS — Z1331 Encounter for screening for depression: Secondary | ICD-10-CM | POA: Diagnosis not present

## 2023-11-22 DIAGNOSIS — Z0000 Encounter for general adult medical examination without abnormal findings: Secondary | ICD-10-CM | POA: Diagnosis not present

## 2023-11-22 DIAGNOSIS — F988 Other specified behavioral and emotional disorders with onset usually occurring in childhood and adolescence: Secondary | ICD-10-CM | POA: Diagnosis not present

## 2023-12-05 ENCOUNTER — Other Ambulatory Visit: Payer: Self-pay

## 2023-12-05 MED ORDER — AMPHETAMINE-DEXTROAMPHETAMINE 10 MG PO TABS
10.0000 mg | ORAL_TABLET | Freq: Two times a day (BID) | ORAL | 0 refills | Status: DC
  Filled 2023-12-05: qty 60, 30d supply, fill #0

## 2024-01-12 ENCOUNTER — Other Ambulatory Visit: Payer: Self-pay

## 2024-01-12 MED ORDER — AMPHETAMINE-DEXTROAMPHETAMINE 10 MG PO TABS
10.0000 mg | ORAL_TABLET | Freq: Two times a day (BID) | ORAL | 0 refills | Status: DC
  Filled 2024-01-12: qty 60, 30d supply, fill #0

## 2024-01-19 ENCOUNTER — Other Ambulatory Visit: Payer: Self-pay

## 2024-01-19 DIAGNOSIS — B0239 Other herpes zoster eye disease: Secondary | ICD-10-CM | POA: Diagnosis not present

## 2024-01-19 MED ORDER — NEOMYCIN-POLYMYXIN-DEXAMETH 3.5-10000-0.1 OP SUSP
1.0000 [drp] | Freq: Three times a day (TID) | OPHTHALMIC | 0 refills | Status: AC
  Filled 2024-01-19: qty 5, 34d supply, fill #0

## 2024-01-19 MED ORDER — VALACYCLOVIR HCL 500 MG PO TABS
1000.0000 mg | ORAL_TABLET | Freq: Three times a day (TID) | ORAL | 0 refills | Status: AC
  Filled 2024-01-19: qty 60, 10d supply, fill #0

## 2024-01-21 ENCOUNTER — Other Ambulatory Visit (HOSPITAL_BASED_OUTPATIENT_CLINIC_OR_DEPARTMENT_OTHER): Payer: Self-pay

## 2024-01-21 ENCOUNTER — Telehealth: Admitting: Nurse Practitioner

## 2024-01-21 ENCOUNTER — Other Ambulatory Visit: Payer: Self-pay

## 2024-01-21 DIAGNOSIS — B0230 Zoster ocular disease, unspecified: Secondary | ICD-10-CM

## 2024-01-21 MED ORDER — PREDNISONE 20 MG PO TABS
40.0000 mg | ORAL_TABLET | Freq: Every day | ORAL | 0 refills | Status: AC
  Filled 2024-01-21 (×2): qty 10, 5d supply, fill #0

## 2024-01-21 NOTE — Progress Notes (Signed)
 Virtual Visit Consent   Kerry Bennett, you are scheduled for a virtual visit with a [facility] provider today. Just as with appointments in the office, your consent must be obtained to participate. Your consent will be active for this visit and any virtual visit you may have with one of our providers in the next 365 days. If you have a MyChart account, a copy of this consent can be sent to you electronically.  As this is a virtual visit, video technology does not allow for your provider to perform a traditional examination. This may limit your provider's ability to fully assess your condition. If your provider identifies any concerns that need to be evaluated in person or the need to arrange testing (such as labs, EKG, etc.), we will make arrangements to do so. Although advances in technology are sophisticated, we cannot ensure that it will always work on either your end or our end. If the connection with a video visit is poor, the visit may have to be switched to a telephone visit. With either a video or telephone visit, we are not always able to ensure that we have a secure connection.  By engaging in this virtual visit, you consent to the provision of healthcare and authorize for your insurance to be billed (if applicable) for the services provided during this visit. Depending on your insurance coverage, you may receive a charge related to this service.  I need to obtain your verbal consent now. Are you willing to proceed with your visit today? Kerry Bennett has provided verbal consent on 01/21/2024 for a virtual visit (video or telephone). Kerry LELON Servant, NP  Date: 01/21/2024 9:22 AM   Virtual Visit via Video Note   I, Kerry Bennett, connected with  Kerry Bennett  ([id number], 07-29-1990) on 01/21/24 at  9:00 AM EDT by a video-enabled telemedicine application and verified that I am speaking with the correct person using two identifiers.  Location: Patient: Virtual Visit Location Patient: Home Provider:  Virtual Visit Location Provider: Home Office   I discussed the limitations of evaluation and management by telemedicine and the availability of in person appointments. The patient expressed understanding and agreed to proceed.    History of Present Illness: Kerry Bennett is a 33 y.o. who identifies as a male who was assigned male at birth, and is being seen today for herpes zoster.  Kerry Bennett was started on valacyclovir  for a shingles outbreak on January 19, 2024.  Today he feels the shingles rash is worsening and seems to be spreading with increased inflammation.  He is currently prescribed valacyclovir  1000 mg 3 times daily for 10 days and currently on day 1-1/2.  He does not endorse any acute vision changes however he was seen by his eye doctor earlier this week with initial eruption of shingles near the eye.   Problems:  Patient Active Problem List   Diagnosis Date Noted   ADHD 10/02/2018    Allergies: No Known Allergies Medications:  Current Outpatient Medications:    predniSONE  (DELTASONE ) 20 MG tablet, Take 2 tablets (40 mg total) by mouth daily with breakfast for 5 days., Disp: 10 tablet, Rfl: 0   ADDERALL XR 20 MG 24 hr capsule, , Disp: , Rfl:    amphetamine -dextroamphetamine  (ADDERALL) 10 MG tablet, Take 1 tablet (10 mg total) by mouth 2 (two) times daily., Disp: 60 tablet, Rfl: 0   indomethacin  (INDOCIN ) 50 MG capsule, Take 1 capsule (50 mg total) by mouth 2 (two) times daily as  needed (Take with Food)., Disp: 30 capsule, Rfl: 0   neomycin -polymyxin b-dexamethasone  (MAXITROL ) 3.5-10000-0.1 SUSP, Place 1 drop into the right eye 3 (three) times daily., Disp: 5 mL, Rfl: 0   valACYclovir  (VALTREX ) 500 MG tablet, Take 2 tablets (1,000 mg total) by mouth 3 (three) times daily for 10 days., Disp: 60 tablet, Rfl: 0  Observations/Objective: Patient is well-developed, well-nourished in no acute distress.  Resting comfortably at home.  Head is normocephalic, atraumatic.  No labored breathing.   Speech is clear and coherent with logical content.  Patient is alert and oriented at baseline.  Numerous vesicular lesions throughout forehead   Assessment and Plan: 1. Herpes zoster with ophthalmic complication, unspecified herpes zoster eye disease (Primary) - predniSONE  (DELTASONE ) 20 MG tablet; Take 2 tablets (40 mg total) by mouth daily with breakfast for 5 days.  Dispense: 10 tablet; Refill: 0   Follow Up Instructions: I discussed the assessment and treatment plan with the patient. The patient was provided an opportunity to ask questions and all were answered. The patient agreed with the plan and demonstrated an understanding of the instructions.  A copy of instructions were sent to the patient via MyChart unless otherwise noted below.    The patient was advised to call back or seek an in-person evaluation if the symptoms worsen or if the condition fails to improve as anticipated.    James Senn W Keylan Costabile, NP

## 2024-01-21 NOTE — Patient Instructions (Signed)
  [facility], thank you for joining Haze LELON Servant, NP for today's virtual visit.  While this provider is not your primary care provider (PCP), if your PCP is located in our provider database this encounter information will be shared with them immediately following your visit.   A [facility] MyChart account gives you access to today's visit and all your visits, tests, and labs performed at [facility]  click here if you don't have a [facility] MyChart account or go to [email or web address]  Consent: (Patient) Kerry Bennett provided verbal consent for this virtual visit at the beginning of the encounter.  Current Medications:  Current Outpatient Medications:    predniSONE  (DELTASONE ) 20 MG tablet, Take 2 tablets (40 mg total) by mouth daily with breakfast for 5 days., Disp: 10 tablet, Rfl: 0   ADDERALL XR 20 MG 24 hr capsule, , Disp: , Rfl:    amphetamine -dextroamphetamine  (ADDERALL) 10 MG tablet, Take 1 tablet (10 mg total) by mouth 2 (two) times daily., Disp: 60 tablet, Rfl: 0   indomethacin  (INDOCIN ) 50 MG capsule, Take 1 capsule (50 mg total) by mouth 2 (two) times daily as needed (Take with Food)., Disp: 30 capsule, Rfl: 0   neomycin -polymyxin b-dexamethasone  (MAXITROL ) 3.5-10000-0.1 SUSP, Place 1 drop into the right eye 3 (three) times daily., Disp: 5 mL, Rfl: 0   valACYclovir  (VALTREX ) 500 MG tablet, Take 2 tablets (1,000 mg total) by mouth 3 (three) times daily for 10 days., Disp: 60 tablet, Rfl: 0   Medications ordered in this encounter:  Meds ordered this encounter  Medications   predniSONE  (DELTASONE ) 20 MG tablet    Sig: Take 2 tablets (40 mg total) by mouth daily with breakfast for 5 days.    Dispense:  10 tablet    Refill:  0    Supervising Provider:   BLAISE ALEENE KIDD [[id number]]     *If you need refills on other medications prior to your next appointment, please contact your pharmacy*  Follow-Up: Call back or seek an in-person evaluation if the symptoms  worsen or if the condition fails to improve as anticipated.  [facility] [phone]   If you have been instructed to have an in-person evaluation today at a local Urgent Care facility, please use the link below. It will take you to a list of all of our available [facility], including address, phone number and hours of operation. Please do not delay care.  [facility]  If you or a family member do not have a primary care provider, use the link below to schedule a visit and establish care. When you choose a [facility] primary care physician or advanced practice provider, you gain a long-term partner in health. Find a Primary Care Provider  Learn more about [facility]'s in-office and virtual care options: [facility] Get Care Now

## 2024-02-27 ENCOUNTER — Other Ambulatory Visit: Payer: Self-pay

## 2024-02-27 MED ORDER — AMPHETAMINE-DEXTROAMPHETAMINE 10 MG PO TABS
10.0000 mg | ORAL_TABLET | Freq: Two times a day (BID) | ORAL | 0 refills | Status: DC
  Filled 2024-02-27: qty 60, 30d supply, fill #0

## 2024-04-05 ENCOUNTER — Other Ambulatory Visit: Payer: Self-pay

## 2024-04-05 MED ORDER — AMPHETAMINE-DEXTROAMPHETAMINE 10 MG PO TABS
10.0000 mg | ORAL_TABLET | Freq: Two times a day (BID) | ORAL | 0 refills | Status: AC
  Filled 2024-04-05: qty 60, 30d supply, fill #0

## 2024-05-15 ENCOUNTER — Other Ambulatory Visit: Payer: Self-pay

## 2024-05-15 MED ORDER — AMPHETAMINE-DEXTROAMPHETAMINE 10 MG PO TABS: 10.0000 mg | ORAL_TABLET | Freq: Two times a day (BID) | ORAL | 0 refills | Status: AC

## 2024-05-15 MED ORDER — AMPHETAMINE-DEXTROAMPHETAMINE 10 MG PO TABS
10.0000 mg | ORAL_TABLET | Freq: Two times a day (BID) | ORAL | 0 refills | Status: AC
  Filled 2024-05-15 – 2024-05-24 (×2): qty 30, 15d supply, fill #0

## 2024-05-24 ENCOUNTER — Other Ambulatory Visit: Payer: Self-pay

## 2024-05-24 ENCOUNTER — Other Ambulatory Visit (HOSPITAL_BASED_OUTPATIENT_CLINIC_OR_DEPARTMENT_OTHER): Payer: Self-pay

## 2024-05-24 DIAGNOSIS — Z8619 Personal history of other infectious and parasitic diseases: Secondary | ICD-10-CM | POA: Diagnosis not present

## 2024-05-24 DIAGNOSIS — F988 Other specified behavioral and emotional disorders with onset usually occurring in childhood and adolescence: Secondary | ICD-10-CM | POA: Diagnosis not present

## 2024-05-24 MED ORDER — AMPHETAMINE-DEXTROAMPHETAMINE 10 MG PO TABS
10.0000 mg | ORAL_TABLET | Freq: Two times a day (BID) | ORAL | 0 refills | Status: AC
  Filled 2024-05-24: qty 60, 30d supply, fill #0

## 2024-05-24 MED ORDER — AMPHETAMINE-DEXTROAMPHETAMINE 10 MG PO TABS
10.0000 mg | ORAL_TABLET | Freq: Two times a day (BID) | ORAL | 0 refills | Status: AC
  Filled 2024-06-29: qty 60, 30d supply, fill #0

## 2024-06-29 ENCOUNTER — Other Ambulatory Visit: Payer: Self-pay

## 2024-07-02 ENCOUNTER — Other Ambulatory Visit: Payer: Self-pay

## 2024-07-02 MED ORDER — AMPHETAMINE-DEXTROAMPHETAMINE 10 MG PO TABS: 10.0000 mg | ORAL_TABLET | Freq: Two times a day (BID) | ORAL | 0 refills | Status: AC
